# Patient Record
Sex: Female | Born: 1995 | Smoking: Never smoker
Health system: Southern US, Community
[De-identification: ages and names within clinical notes are randomized; demographics above are authoritative.]

---

## 2017-07-24 ENCOUNTER — Ambulatory Visit: Payer: Self-pay | Admitting: Physician Assistant

## 2017-07-26 ENCOUNTER — Ambulatory Visit: Payer: Self-pay | Admitting: Physician Assistant

## 2017-07-26 ENCOUNTER — Encounter: Payer: Self-pay | Admitting: Physician Assistant

## 2017-07-26 VITALS — BP 102/64 | HR 57 | Temp 98.3°F | Ht 67.5 in | Wt 156.2 lb

## 2017-07-26 DIAGNOSIS — Z7689 Persons encountering health services in other specified circumstances: Secondary | ICD-10-CM

## 2017-07-26 NOTE — Progress Notes (Signed)
Rachel Munoz is a 22 y.o. female here to Establish Care.  I acted as a Neurosurgeonscribe for [employer] Vanilla [place], PA-C Corky Mullonna Orphanos, LPN  History of Present Illness:   Chief Complaint  Patient presents with  . Establish Care    Self pay   Patient is here to establish care. She has no acute issues. She currently goes to [employer] and is planning to start college in August. She lives in [place] with her mom, dad and brother. She is originally from [place]. She moved to [place] from [place].  She is UTD on vaccines. No significant past medical or surgical history. We reviewed her family history.  She had a sports physical performed at [facility] last week.  She states that it was normal.  She is not sexually active. Denies hx of depression or anxiety or suicidal thoughts. Has a good good support system  Has not seen a dentist or eye doctor since moving to [place].  Weight -- Weight: 156 lb 4 oz (70.9 kg)  -- normal weight for her   [facility] 07/26/2017  Decreased Interest 0  Down, Depressed, Hopeless 0  PHQ - 2 Score 0    No flowsheet data found.  Other providers/specialists: None  History reviewed. No pertinent past medical history.   Social History   Socioeconomic History  . Marital status: Unknown    Spouse name: Not on file  . Number of children: Not on file  . Years of education: Not on file  . Highest education level: Not on file  Social Needs  . Financial resource strain: Not on file  . Food insecurity - worry: Not on file  . Food insecurity - inability: Not on file  . Transportation needs - medical: Not on file  . Transportation needs - non-medical: Not on file  Occupational History  . Not on file  Tobacco Use  . Smoking status: Never Smoker  . Smokeless tobacco: Never Used  Substance and Sexual Activity  . Alcohol use: No    Frequency: Never  . Drug use: No  . Sexual activity: Yes  Other Topics Concern  . Not on  file  Social History Narrative  . Not on file    History reviewed. No pertinent surgical history.  Family History  Problem Relation Age of Onset  . Miscarriages / [place] Mother   . Brain cancer Maternal Grandfather   . Glaucoma Paternal Grandmother   . Heart attack Paternal Grandfather 40       survived  . Breast cancer Other     Allergies  Allergen Reactions  . Ibuprofen Rash     Current Medications:  No current outpatient medications on file.   Review of Systems:   ROS  Negative unless otherwise specified per HPI.  Vitals:   Vitals:   07/26/17 1512  BP: 102/64  Pulse: (!) 57  Temp: 98.3 F (36.8 C)  TempSrc: Oral  SpO2: 97%  Weight: 156 lb 4 oz (70.9 kg)  Height: 5' 7.5" (1.715 m)     Body mass index is 24.11 kg/m.  Physical Exam:   Physical Exam  Constitutional: She appears well-developed. She is cooperative.  Non-toxic appearance. She does not have a sickly appearance. She does not appear ill. No distress.  Cardiovascular: Normal rate, regular rhythm, S1 normal, S2 normal, normal heart sounds and normal pulses.  No LE edema  Pulmonary/Chest: Effort normal and breath sounds normal.  Neurological: She is alert. GCS eye subscore is  4. GCS verbal subscore is 5. GCS motor subscore is 6.  Skin: Skin is warm, dry and intact.  Psychiatric: She has a normal mood and affect. Her speech is normal and behavior is normal.  Nursing note and vitals reviewed.   Assessment and Plan:    Jennafer was seen today for establish care.  Diagnoses and all orders for this visit:  Encounter to establish care Patient has no acute needs today and is here to establish care. Discussed resources available in our office (Sports Medicine Provider, Event organiser, etc.) should she need them. Patient and mother appreciative of visit.  . Reviewed expectations re: course of current medical issues. . Discussed self-management of symptoms. . Outlined signs and symptoms  indicating need for more acute intervention. . Patient verbalized understanding and all questions were answered. . See orders for this visit as documented in the electronic medical record. . Patient received an After-Visit Summary.   CMA or LPN served as scribe during this visit. History, Physical, and Plan performed by medical provider. Documentation and orders reviewed and attested to.   Jarold Motto, PA-C

## 2017-07-26 NOTE — Patient Instructions (Signed)
It was great to meet you, welcome to [facility]!  If you have any medical issues please make an appointment with us.  We also have a sports medicine physician here, Dr. Gaspar BiddingMichael Rigby, should you experience any sports-related injury.  Good luck with the rest of your senior year :)

## 2017-09-06 ENCOUNTER — Ambulatory Visit (INDEPENDENT_AMBULATORY_CARE_PROVIDER_SITE_OTHER): Payer: Self-pay

## 2017-09-06 ENCOUNTER — Encounter: Payer: Self-pay | Admitting: Physician Assistant

## 2017-09-06 ENCOUNTER — Ambulatory Visit: Payer: Self-pay | Admitting: Physician Assistant

## 2017-09-06 VITALS — BP 110/68 | HR 64 | Temp 98.3°F | Ht 67.5 in | Wt 158.2 lb

## 2017-09-06 DIAGNOSIS — S99911A Unspecified injury of right ankle, initial encounter: Secondary | ICD-10-CM

## 2017-09-06 DIAGNOSIS — M25571 Pain in right ankle and joints of right foot: Secondary | ICD-10-CM

## 2017-09-06 NOTE — Progress Notes (Signed)
Rachel MiresJacinta Munoz is a 22 y.o. female here for a new problem.  I acted as a Neurosurgeonscribe for [employer] Brighton Pil[place], PA-C Rachel Mullonna Orphanos, LPN  History of Present Illness:   Chief Complaint  Patient presents with  . RIght ankle injury    Ankle Injury   The incident occurred 12 to 24 hours ago. The incident occurred at school. The injury mechanism was a twisting injury. The pain is present in the right ankle. The quality of the pain is described as aching. The pain is at a severity of 6/10. The pain is moderate. The pain has been fluctuating since onset. Associated symptoms include an inability to bear weight and a loss of motion. Pertinent negatives include no loss of sensation, numbness or tingling. She reports no foreign bodies present. The symptoms are aggravated by movement and weight bearing. She has tried acetaminophen for the symptoms. The treatment provided moderate relief.   Does report that she has had issues with her ankles in the past, however there hasn't been an injury in the past where she had this sensation of "throbbing" that she initially had with this injury. The throbbing has essentially resolved. She is currently on crutches. She has an allergy to ibuprofen where she has had a rash in the past, mom reports that they haven't tried anything else for anti-inflammatory in the past.  History reviewed. No pertinent past medical history.   Social History   Socioeconomic History  . Marital status: Unknown    Spouse name: Not on file  . Number of children: Not on file  . Years of education: Not on file  . Highest education level: Not on file  Occupational History  . Not on file  Social Needs  . Financial resource strain: Not on file  . Food insecurity:    Worry: Not on file    Inability: Not on file  . Transportation needs:    Medical: Not on file    Non-medical: Not on file  Tobacco Use  . Smoking status: Never Smoker  . Smokeless tobacco: Never Used  Substance and  Sexual Activity  . Alcohol use: No    Frequency: Never  . Drug use: No  . Sexual activity: Yes  Lifestyle  . Physical activity:    Days per week: Not on file    Minutes per session: Not on file  . Stress: Not on file  Relationships  . Social connections:    Talks on phone: Not on file    Gets together: Not on file    Attends religious service: Not on file    Active member of club or organization: Not on file    Attends meetings of clubs or organizations: Not on file    Relationship status: Not on file  . Intimate partner violence:    Fear of current or ex partner: Not on file    Emotionally abused: Not on file    Physically abused: Not on file    Forced sexual activity: Not on file  Other Topics Concern  . Not on file  Social History Narrative  . Not on file    History reviewed. No pertinent surgical history.  Family History  Problem Relation Age of Onset  . Miscarriages / [place] Mother   . Brain cancer Maternal Grandfather   . Glaucoma Paternal Grandmother   . Heart attack Paternal Grandfather 40       survived  . Breast cancer Other     Allergies  Allergen Reactions  .  Ibuprofen Rash    Current Medications:  No current outpatient medications on file.   Review of Systems:   Review of Systems  Neurological: Negative for tingling and numbness.  Negative unless otherwise specified per HPI.   Vitals:   Vitals:   09/06/17 0944  BP: 110/68  Pulse: 64  Temp: 98.3 F (36.8 C)  TempSrc: Oral  SpO2: 98%  Weight: 158 lb 4 oz (71.8 kg)  Height: 5' 7.5" (1.715 m)     Body mass index is 24.42 kg/m.  Physical Exam:   Physical Exam  Constitutional: She is oriented to person, place, and time. She appears well-developed and well-nourished.  HENT:  Head: Normocephalic and atraumatic.  Eyes: Conjunctivae and EOM are normal.  Neck: Normal range of motion. Neck supple.  Cardiovascular:  Pulses:      Dorsalis pedis pulses are 2+ on the right side, and  2+ on the left side.       Posterior tibial pulses are 2+ on the right side, and 2+ on the left side.  Pulmonary/Chest: Effort normal.  Musculoskeletal: Normal range of motion.  Overall foot and ankle are well aligned, very mild swelling present to lateral malleolus. No significant TTP over the base of the 5th metatarsal or navicular. TTP to inferior area of lateral malleolus Stable to ankle drawer   Neurological: She is alert and oriented to person, place, and time. No cranial nerve deficit or sensory deficit.  Normal sensation to bilateral feet  Skin: Skin is warm and dry.  Psychiatric: She has a normal mood and affect. Her behavior is normal. Judgment and thought content normal.   Xray without acute findings, based on initial read with Dr. Berline Chough -- awaiting official radiology read at this time.  Assessment and Plan:    Rachel Munoz was seen today for right ankle injury.  Diagnoses and all orders for this visit:  Injury of right ankle, initial encounter -     DG Ankle Complete Right; Future   Official radiology read pending at this time. Suspect strain. No red flags on my exam.  Discussed plan of care with Dr. Berline Chough. Continue supportive care measures. Mom states that she is going to talk to the pharmacist to see if they have recommendations for an anti-inflammatory given her ibuprofen allergy. We briefly discussed the use of meloxicam however, she stated she would see if pharmacist was in agreement of this option and she would let us know. Recommend RICE and use of cool water to submerge ankle to help with swelling if needed. Follow-up with Dr. Berline Chough in 1 week if symptoms persist, sooner if they worsen.   . Reviewed expectations re: course of current medical issues. . Discussed self-management of symptoms. . Outlined signs and symptoms indicating need for more acute intervention. . Patient verbalized understanding and all questions were answered. . See orders for this visit as  documented in the electronic medical record. . Patient received an After-Visit Summary.  CMA or LPN served as scribe during this visit. History, Physical, and Plan performed by medical provider. Documentation and orders reviewed and attested to.  Jarold Motto, PA-C

## 2017-09-07 ENCOUNTER — Telehealth: Payer: Self-pay | Admitting: Physician Assistant

## 2017-09-07 NOTE — Telephone Encounter (Signed)
Spoke to pt's mother Leodis LiverpoolMarise she said someone called her yesterday. Told her I called the home number first and it said not to leave a message so I did not and then I spoke to pt on mobile # and told her x-ray was negative. Marise verbalized understanding and said her daughter never told her, thank you. Told her okay.

## 2017-09-07 NOTE — Telephone Encounter (Signed)
Copied from CRM [id number]. Topic: General - Call Back - No Documentation >> Sep 06, 2017  5:16 PM Landry MellowFoltz, Melissa J wrote: Reason for CRM: mom is returning cal - please call [phone] if returning call tomorrow Please call [phone] if returning the call this evening

## 2019-05-16 IMAGING — DX DG ANKLE COMPLETE 3+V*R*
3 series · 3 of 3 positions shown · non-contrast
Comparison: None.

CLINICAL DATA: Twisting injury

EXAM:
RIGHT ANKLE - COMPLETE 3+ VIEW

[ankle ap]
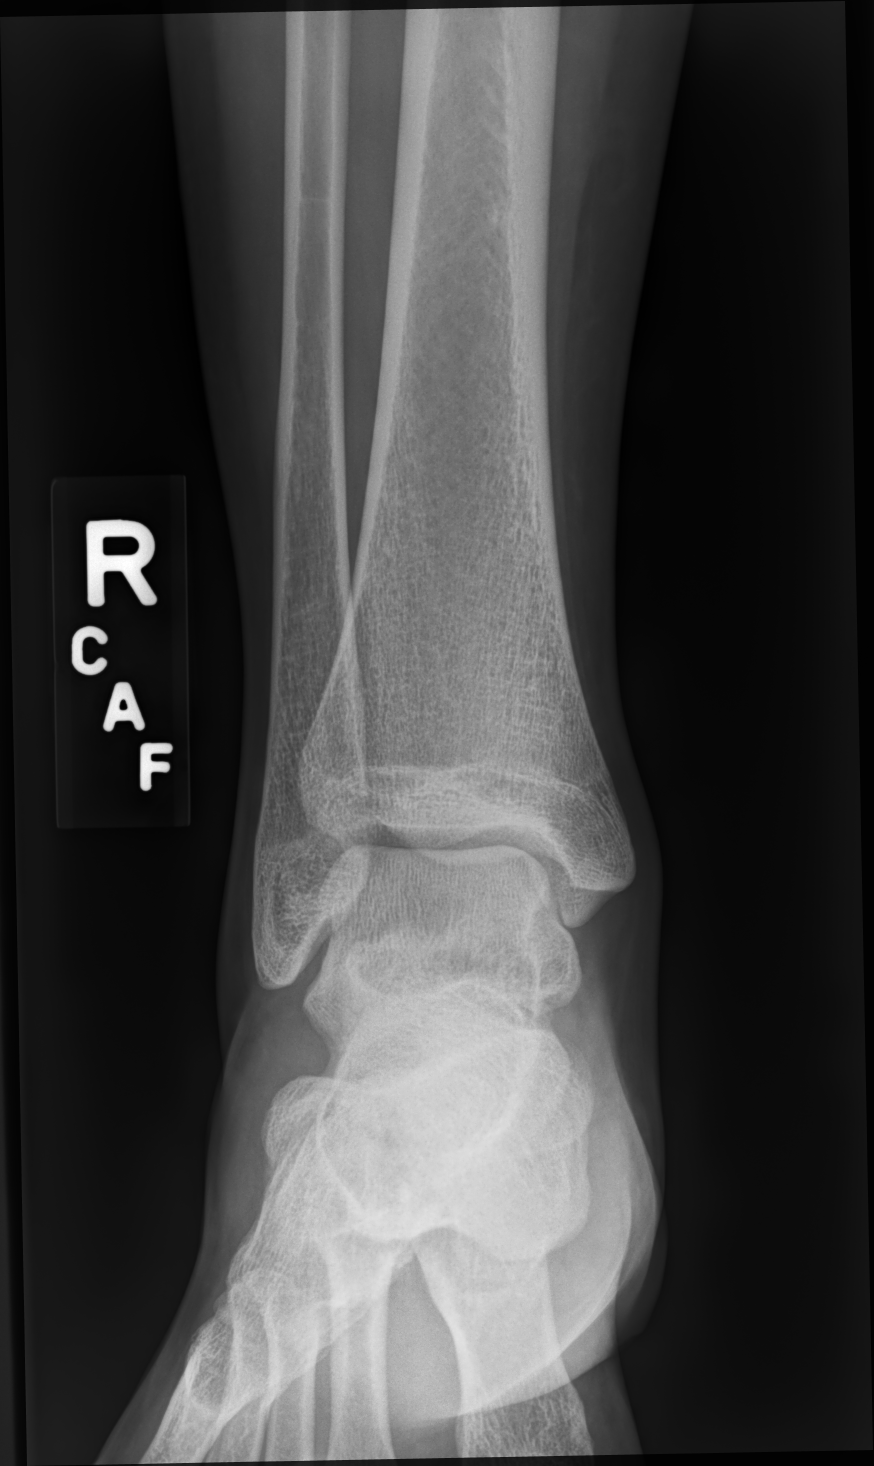

[ankle oblique]
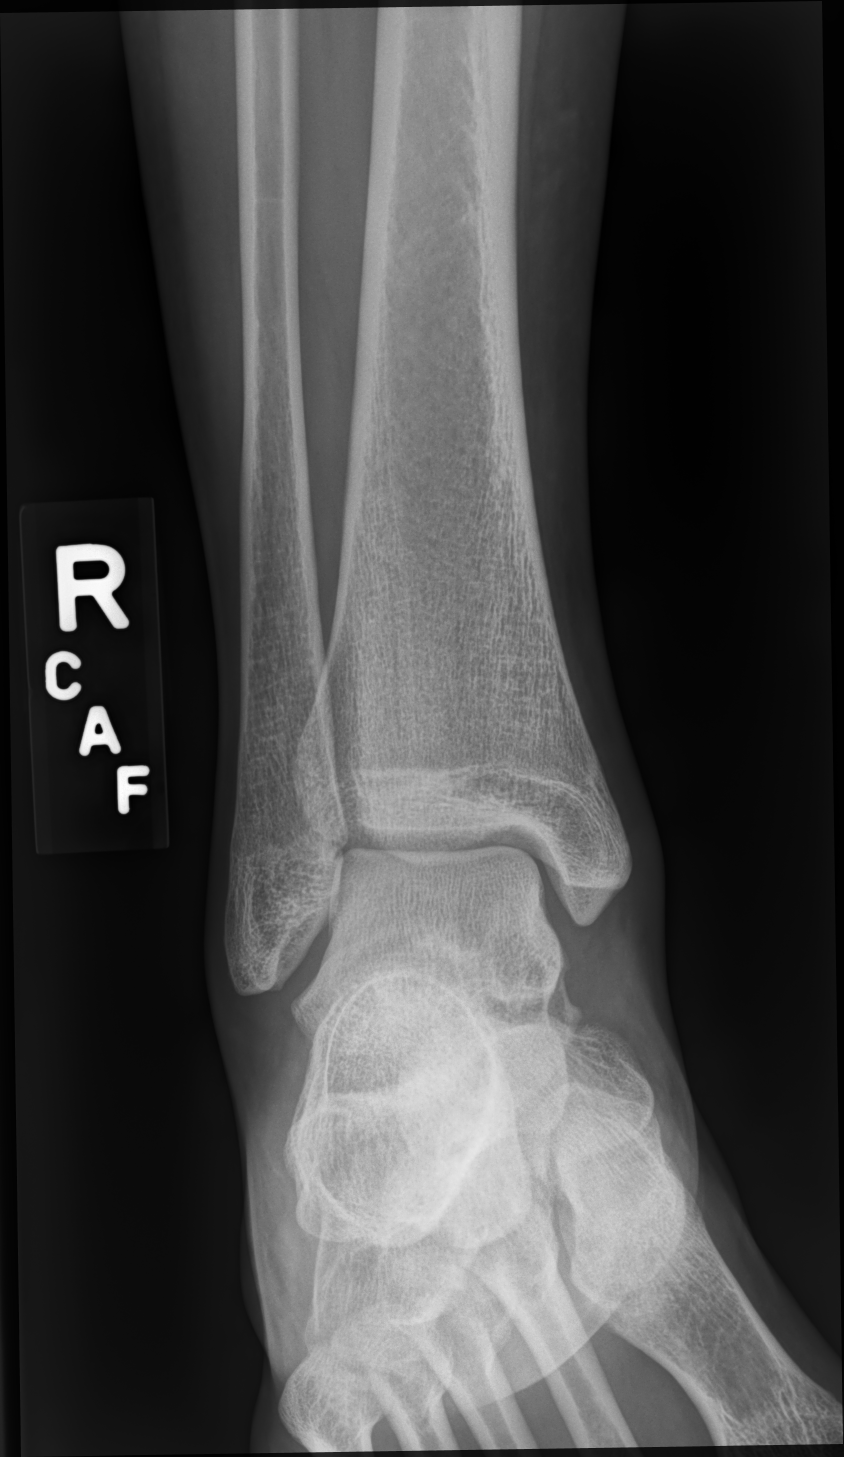

[ankle lat]
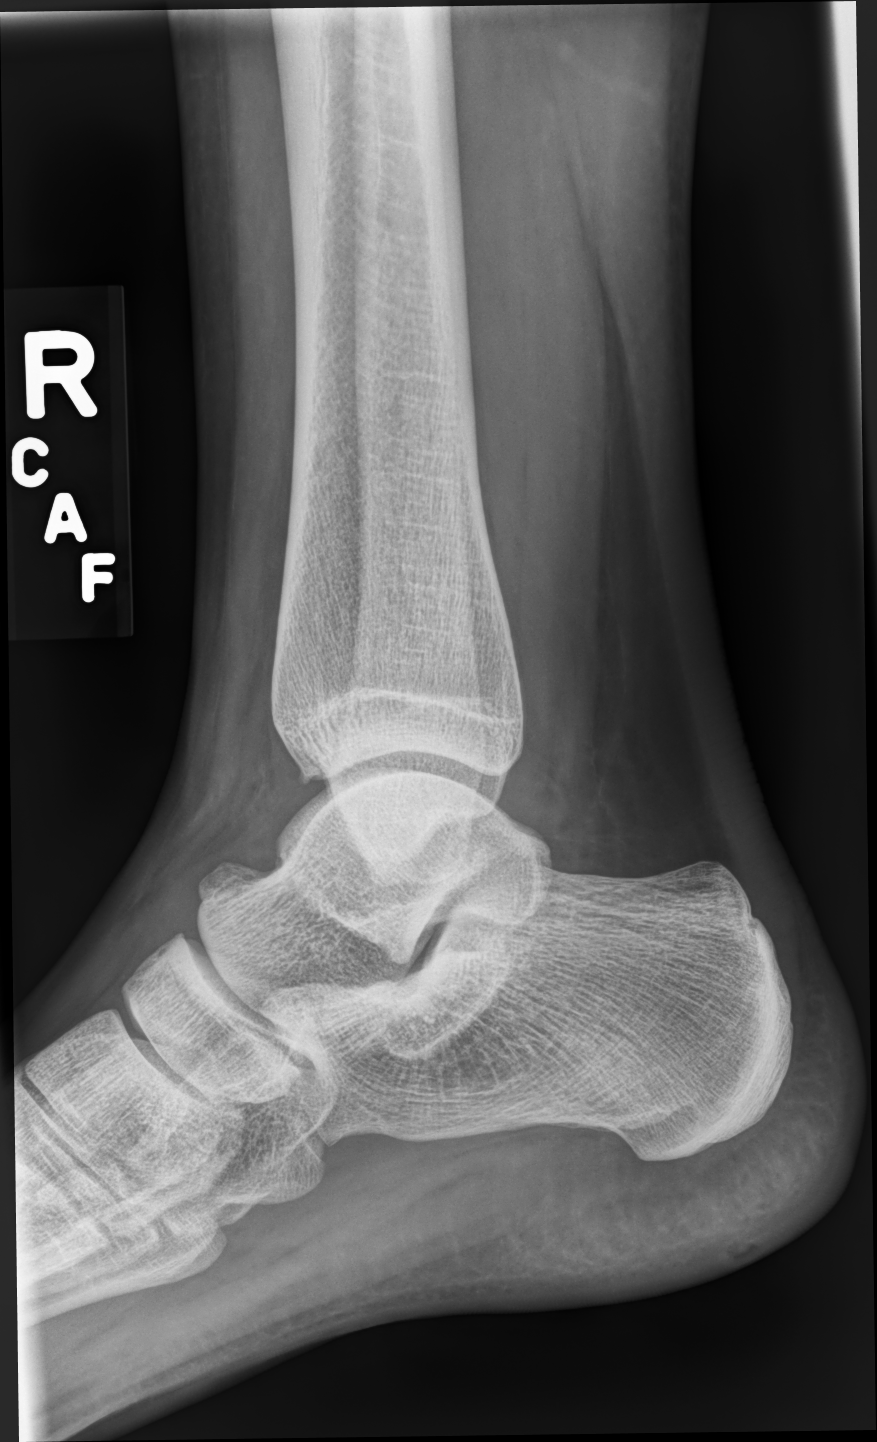

[3 of 3 positions shown; findings below may reference images not displayed]

FINDINGS: There is no evidence of fracture, dislocation, or joint effusion.
There is no evidence of arthropathy or other focal bone abnormality.
Soft tissues are unremarkable.
IMPRESSION: Negative.
